# Patient Record
Sex: Male | Born: 1970 | Race: Black or African American | Hispanic: No | Marital: Single | Smoking: Never smoker
Health system: Southern US, Community
[De-identification: ages and names within clinical notes are randomized; demographics above are authoritative.]

## PROBLEM LIST (undated history)

## (undated) DIAGNOSIS — D869 Sarcoidosis, unspecified: Secondary | ICD-10-CM

## (undated) HISTORY — PX: LIVER BIOPSY: SHX301

---

## 2009-12-04 ENCOUNTER — Emergency Department (HOSPITAL_BASED_OUTPATIENT_CLINIC_OR_DEPARTMENT_OTHER): Admission: EM | Admit: 2009-12-04 | Discharge: 2009-12-04 | Payer: Self-pay | Admitting: Emergency Medicine

## 2010-03-25 ENCOUNTER — Emergency Department (HOSPITAL_COMMUNITY): Admission: EM | Admit: 2010-03-25 | Discharge: 2010-03-25 | Payer: Self-pay | Admitting: Emergency Medicine

## 2013-06-01 ENCOUNTER — Other Ambulatory Visit: Payer: Self-pay | Admitting: Family Medicine

## 2013-06-01 DIAGNOSIS — R7989 Other specified abnormal findings of blood chemistry: Secondary | ICD-10-CM

## 2013-06-01 DIAGNOSIS — R945 Abnormal results of liver function studies: Principal | ICD-10-CM

## 2013-06-04 ENCOUNTER — Other Ambulatory Visit: Payer: Self-pay | Admitting: Family Medicine

## 2013-06-04 ENCOUNTER — Ambulatory Visit
Admission: RE | Admit: 2013-06-04 | Discharge: 2013-06-04 | Disposition: A | Discharge status: Home / Self Care | Payer: Managed Care, Other (non HMO) | Source: Ambulatory Visit | Attending: Family Medicine | Admitting: Family Medicine

## 2013-06-04 DIAGNOSIS — R05 Cough: Secondary | ICD-10-CM

## 2013-06-04 DIAGNOSIS — R7989 Other specified abnormal findings of blood chemistry: Secondary | ICD-10-CM

## 2013-06-04 DIAGNOSIS — R059 Cough, unspecified: Secondary | ICD-10-CM

## 2013-06-04 DIAGNOSIS — R945 Abnormal results of liver function studies: Secondary | ICD-10-CM

## 2013-06-04 DIAGNOSIS — R9389 Abnormal findings on diagnostic imaging of other specified body structures: Secondary | ICD-10-CM

## 2013-06-08 ENCOUNTER — Ambulatory Visit
Admission: RE | Admit: 2013-06-08 | Discharge: 2013-06-08 | Disposition: A | Discharge status: Home / Self Care | Payer: Managed Care, Other (non HMO) | Source: Ambulatory Visit | Attending: Family Medicine | Admitting: Family Medicine

## 2013-06-08 DIAGNOSIS — R945 Abnormal results of liver function studies: Principal | ICD-10-CM

## 2013-06-08 DIAGNOSIS — R7989 Other specified abnormal findings of blood chemistry: Secondary | ICD-10-CM

## 2013-06-11 ENCOUNTER — Ambulatory Visit
Admission: RE | Admit: 2013-06-11 | Discharge: 2013-06-11 | Disposition: A | Discharge status: Home / Self Care | Payer: Managed Care, Other (non HMO) | Source: Ambulatory Visit | Attending: Family Medicine | Admitting: Family Medicine

## 2013-06-11 DIAGNOSIS — R945 Abnormal results of liver function studies: Secondary | ICD-10-CM

## 2013-06-11 DIAGNOSIS — R9389 Abnormal findings on diagnostic imaging of other specified body structures: Secondary | ICD-10-CM

## 2013-06-11 DIAGNOSIS — R7989 Other specified abnormal findings of blood chemistry: Secondary | ICD-10-CM

## 2013-06-11 MED ORDER — IOHEXOL 300 MG/ML  SOLN
100.0000 mL | Freq: Once | INTRAMUSCULAR | Status: AC | PRN
  Administered 2013-06-11: 100 mL via INTRAVENOUS

## 2013-06-16 ENCOUNTER — Other Ambulatory Visit (HOSPITAL_COMMUNITY): Payer: Self-pay | Admitting: Family Medicine

## 2013-06-16 DIAGNOSIS — D869 Sarcoidosis, unspecified: Secondary | ICD-10-CM

## 2013-06-17 ENCOUNTER — Other Ambulatory Visit: Payer: Self-pay | Admitting: Radiology

## 2013-06-17 ENCOUNTER — Encounter (HOSPITAL_COMMUNITY): Payer: Self-pay | Admitting: Pharmacy Technician

## 2013-06-18 ENCOUNTER — Other Ambulatory Visit: Payer: Self-pay | Admitting: Radiology

## 2013-06-21 ENCOUNTER — Ambulatory Visit (HOSPITAL_COMMUNITY)
Admission: RE | Admit: 2013-06-21 | Discharge: 2013-06-21 | Disposition: A | Discharge status: Home / Self Care | Payer: Managed Care, Other (non HMO) | Source: Ambulatory Visit | Attending: Family Medicine | Admitting: Family Medicine

## 2013-06-21 ENCOUNTER — Encounter (HOSPITAL_COMMUNITY): Payer: Self-pay

## 2013-06-21 DIAGNOSIS — R634 Abnormal weight loss: Secondary | ICD-10-CM | POA: Insufficient documentation

## 2013-06-21 DIAGNOSIS — R945 Abnormal results of liver function studies: Secondary | ICD-10-CM | POA: Insufficient documentation

## 2013-06-21 DIAGNOSIS — D869 Sarcoidosis, unspecified: Secondary | ICD-10-CM

## 2013-06-21 DIAGNOSIS — R05 Cough: Secondary | ICD-10-CM | POA: Insufficient documentation

## 2013-06-21 DIAGNOSIS — R599 Enlarged lymph nodes, unspecified: Secondary | ICD-10-CM | POA: Insufficient documentation

## 2013-06-21 DIAGNOSIS — K7689 Other specified diseases of liver: Secondary | ICD-10-CM | POA: Insufficient documentation

## 2013-06-21 DIAGNOSIS — R059 Cough, unspecified: Secondary | ICD-10-CM | POA: Insufficient documentation

## 2013-06-21 DIAGNOSIS — R748 Abnormal levels of other serum enzymes: Secondary | ICD-10-CM | POA: Insufficient documentation

## 2013-06-21 LAB — APTT: aPTT: 31 seconds (ref 24–37)

## 2013-06-21 LAB — CBC
HCT: 40.3 % (ref 39.0–52.0)
HEMOGLOBIN: 13.5 g/dL (ref 13.0–17.0)
MCH: 27.6 pg (ref 26.0–34.0)
MCHC: 33.5 g/dL (ref 30.0–36.0)
MCV: 82.2 fL (ref 78.0–100.0)
PLATELETS: 322 10*3/uL (ref 150–400)
RBC: 4.9 MIL/uL (ref 4.22–5.81)
RDW: 14.1 % (ref 11.5–15.5)
WBC: 5.3 10*3/uL (ref 4.0–10.5)

## 2013-06-21 LAB — PROTIME-INR
INR: 1.11 (ref 0.00–1.49)
Prothrombin Time: 14.1 seconds (ref 11.6–15.2)

## 2013-06-21 MED ORDER — FENTANYL CITRATE 0.05 MG/ML IJ SOLN
INTRAMUSCULAR | Status: AC
  Filled 2013-06-21: qty 4

## 2013-06-21 MED ORDER — MIDAZOLAM HCL 2 MG/2ML IJ SOLN
INTRAMUSCULAR | Status: AC | PRN
  Administered 2013-06-21: 1 mg via INTRAVENOUS
  Administered 2013-06-21: 2 mg via INTRAVENOUS

## 2013-06-21 MED ORDER — MIDAZOLAM HCL 2 MG/2ML IJ SOLN
INTRAMUSCULAR | Status: AC
  Filled 2013-06-21: qty 4

## 2013-06-21 MED ORDER — OXYCODONE HCL 5 MG PO TABS
5.0000 mg | ORAL_TABLET | ORAL | Status: DC | PRN
  Filled 2013-06-21: qty 1

## 2013-06-21 MED ORDER — SODIUM CHLORIDE 0.9 % IV SOLN
Freq: Once | INTRAVENOUS | Status: AC
  Administered 2013-06-21: 20 mL/h via INTRAVENOUS

## 2013-06-21 MED ORDER — FENTANYL CITRATE 0.05 MG/ML IJ SOLN
INTRAMUSCULAR | Status: AC | PRN
  Administered 2013-06-21: 100 ug via INTRAVENOUS

## 2013-06-21 NOTE — Procedures (Signed)
US guided liver biopsy.  2 cores obtained.  No immediate complication.

## 2013-06-21 NOTE — Discharge Instructions (Signed)
Conscious Sedation, Adult, Care After °Refer to this sheet in the next few weeks. These instructions provide you with information on caring for yourself after your procedure. Your health care provider may also give you more specific instructions. Your treatment has been planned according to current medical practices, but problems sometimes occur. Call your health care provider if you have any problems or questions after your procedure. °WHAT TO EXPECT AFTER THE PROCEDURE  °After your procedure: °· You may feel sleepy, clumsy, and have poor balance for several hours. °· Vomiting may occur if you eat too soon after the procedure. °HOME CARE INSTRUCTIONS °· Do not participate in any activities where you could become injured for at least 24 hours. Do not: °· Drive. °· Swim. °· Ride a bicycle. °· Operate heavy machinery. °· Cook. °· Use power tools. °· Climb ladders. °· Work from a high place. °· Do not make important decisions or sign legal documents until you are improved. °· If you vomit, drink water, juice, or soup when you can drink without vomiting. Make sure you have little or no nausea before eating solid foods. °· Only take over-the-counter or prescription medicines for pain, discomfort, or fever as directed by your health care provider. °· Make sure you and your family fully understand everything about the medicines given to you, including what side effects may occur. °· You should not drink alcohol, take sleeping pills, or take medicines that cause drowsiness for at least 24 hours. °· If you smoke, do not smoke without supervision. °· If you are feeling better, you may resume normal activities 24 hours after you were sedated. °· Keep all appointments with your health care provider. °SEEK MEDICAL CARE IF: °· Your skin is pale or bluish in color. °· You continue to feel nauseous or vomit. °· Your pain is getting worse and is not helped by medicine. °· You have bleeding or swelling. °· You are still sleepy or  feeling clumsy after 24 hours. °SEEK IMMEDIATE MEDICAL CARE IF: °· You develop a rash. °· You have difficulty breathing. °· You develop any type of allergic problem. °· You have a fever. °MAKE SURE YOU: °· Understand these instructions. °· Will watch your condition. °· Will get help right away if you are not doing well or get worse. °Document Released: 02/03/2013 Document Reviewed: 11/20/2012 °[employer]® Patient Information ©2014 [employer]. ° °Liver Biopsy °Care After °Refer to this sheet in the next few weeks. These discharge instructions provide you with general information on caring for yourself after you leave the hospital. Your caregiver may also give you specific instructions. Your treatment has been planned according to the most current medical practices available, but unavoidable complications sometimes occur. If you have any problems or questions after discharge, please call your caregiver. °HOME CARE INSTRUCTIONS  °· You should rest for 1 to 2 days or as instructed. °· If you go home the same day as your procedure (outpatient), have a responsible adult take you home and stay with you overnight. °· Do not lift more than 5 pounds or play contact sports for 2 weeks. °· Do not drive for 24 hours after this test. °· Do not take medicine containing aspirin or drink alcohol for 1 week after this test. °· Change bandages (dressings) as directed. °· Only take over-the-counter or prescription medicines for pain, discomfort, or fever as directed by your caregiver. °OBTAINING YOUR TEST RESULTS °Not all test results are available during your visit. If your test results are not back   during the visit, make an appointment with your caregiver to find out the results. Do not assume everything is normal if you have not heard from your caregiver or the medical facility. It is important for you to follow up on all of your test results. °SEEK MEDICAL CARE IF:  °· You have increased bleeding (more than a small spot) from the  biopsy site. °· You have redness, swelling, or increasing pain in the biopsy site. °· You have an oral temperature above 102° F (38.9° C). °SEEK IMMEDIATE MEDICAL CARE IF:  °· You develop swelling or pain in the belly (abdomen). °· You develop a rash. °· You have difficulty breathing, feel short of breath, or feel faint. °· You develop any reaction or side effects to medicines given. °MAKE SURE YOU:  °· Understand these instructions. °· Will watch your condition. °· Will get help right away if you are not doing well or get worse. °Document Released: 11/02/2004 Document Revised: 07/08/2011 Document Reviewed: 11/26/2007 °[employer]® Patient Information ©2014 [employer]. ° °

## 2013-06-21 NOTE — H&P (Signed)
Chief Complaint: "I am here for a liver biopsy." Referring Physician: Dr. Duanne Guessewey HPI: Colton Turner is an 43 y.o. male who presented with complaints of weight loss 20lbs and non-productive cough since 03/2013. He denies any hemoptysis, fever, chills or night sweats. He denies any chest pain, but does admit to shortness of breath with exertion. He denies any active bleeding, or taking any blood thinners. He denies any known difficulty with sedation, he denies any history of sleep apnea. He was found to have elevated LFT's and sent for an ultrasound on 06/08/13 findings include negative abdominal ultrasound except for a tiny benign appearing cyst in the right lobe of the liver and later had a CT chest/abdomen/pelvis on 06/11/13 with abnormal findings. The patient is here today for a image guided liver biopsy with concern for sarcoidosis.   Past Medical History: History reviewed. No pertinent past medical history.  Past Surgical History: History reviewed. No pertinent past surgical history.  Family History: History reviewed. No pertinent family history.  Social History:  reports that he has never smoked. He has never used smokeless tobacco. He reports that he does not use illicit drugs. His alcohol history is not on file.  Allergies: No Known Allergies  Medications:   Medication List    Notice   You have not been prescribed any medications.     Please HPI for pertinent positives, otherwise complete 10 system ROS negative.  Physical Exam: BP 117/80  Pulse 95  Temp(Src) 98.2 F (36.8 C) (Oral)  Resp 16  SpO2 98% There is no height or weight on file to calculate BMI.  General Appearance:  Alert, cooperative, no distress  Head:  Normocephalic, without obvious abnormality, atraumatic  Neck: Supple, symmetrical, trachea midline  Lungs:   Clear to auscultation bilaterally, no w/r/r, respirations unlabored without use of accessory muscles.  Chest Wall:  No tenderness or deformity  Heart:   Regular rate and rhythm, S1, S2 normal, no murmur, rub or gallop.  Abdomen:   Soft, non-tender, non distended, (+) BS  Extremities: Extremities normal, atraumatic, no cyanosis or edema  Pulses: 2+ and symmetric  Neurologic: Normal affect, no gross deficits.   Results for orders placed during the hospital encounter of 06/21/13 (from the past 48 hour(s))  APTT     Status: None   Collection Time    06/21/13  8:18 AM      Result Value Ref Range   aPTT 31  24 - 37 seconds  CBC     Status: None   Collection Time    06/21/13  8:18 AM      Result Value Ref Range   WBC 5.3  4.0 - 10.5 K/uL   RBC 4.90  4.22 - 5.81 MIL/uL   Hemoglobin 13.5  13.0 - 17.0 g/dL   HCT 30.840.3  65.739.0 - 84.652.0 %   MCV 82.2  78.0 - 100.0 fL   MCH 27.6  26.0 - 34.0 pg   MCHC 33.5  30.0 - 36.0 g/dL   RDW 96.214.1  95.211.5 - 84.115.5 %   Platelets 322  150 - 400 K/uL  PROTIME-INR     Status: None   Collection Time    06/21/13  8:18 AM      Result Value Ref Range   Prothrombin Time 14.1  11.6 - 15.2 seconds   INR 1.11  0.00 - 1.49   No results found.  Assessment/Plan Weight Loss. Cough. Elevated LFT's Abnormal findings on CT 06/11/13, subpleural nodules, lymphadenopathy, abnormal liver  enhancement.  Request for image guided liver biopsy. Patient has been NPO, labs reviewed, no blood thinners. Risks and Benefits discussed with the patient. All of the patient's questions were answered, patient is agreeable to proceed. Consent signed and in chart.  Pattricia Boss D PA-C 06/21/2013, 9:14 AM

## 2013-06-25 ENCOUNTER — Ambulatory Visit (INDEPENDENT_AMBULATORY_CARE_PROVIDER_SITE_OTHER): Payer: Managed Care, Other (non HMO) | Admitting: Pulmonary Disease

## 2013-06-25 ENCOUNTER — Encounter: Payer: Self-pay | Admitting: Pulmonary Disease

## 2013-06-25 VITALS — BP 122/86 | HR 91 | Temp 98.3°F | Ht 70.0 in | Wt 155.4 lb

## 2013-06-25 DIAGNOSIS — D869 Sarcoidosis, unspecified: Secondary | ICD-10-CM | POA: Insufficient documentation

## 2013-06-25 DIAGNOSIS — R059 Cough, unspecified: Secondary | ICD-10-CM | POA: Insufficient documentation

## 2013-06-25 DIAGNOSIS — R05 Cough: Secondary | ICD-10-CM | POA: Insufficient documentation

## 2013-06-25 NOTE — Assessment & Plan Note (Signed)
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 He is currently on a moderate dose of prednisone that he just started, and I would like to initiate treatment with inhaled corticosteroids to help his cough until the prednisone kicks in or his dose is increased to higher levels for treatment of hepatic involvement. I think his cough will resolve once he has been on prednisone for a period of time.

## 2013-06-25 NOTE — Assessment & Plan Note (Signed)
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 He is also had a positive ACE level, and abnormal LFTs. He has. Limited disease on his CT chest, primarily with hilar and mediastinal lymphadenopathy. He does not have an interstitial process, nor does he have significant pulmonary symptoms at this time. He does have a dry cough but I suspect is related to airway involvement with sarcoid. This is typically treated with inhaled corticosteroids if the patient is not going to stay on oral prednisone at higher doses. The patient does not require prednisone from a pulmonary standpoint, but I totally agree with GI consultation since his liver is significantly involved. The aggressiveness of therapy will really be determined by his liver disease. I have had a long discussion with him about the pathophysiology of sarcoidosis, and how we usually treat only with symptoms and end organ damage.  I would like to schedule for pulmonary function studies to establish his baseline, and I have stressed to him the importance of yearly eye exams.

## 2013-06-25 NOTE — Progress Notes (Signed)
   Subjective:    Patient ID: Colton Turner, male    DOB: 12/21/1970, 43 y.o.   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 He was in his usual state of health until December of last year, when he began to develop ankle and feet pain, as well as a dry cough. He began to have worsening symptoms, and ultimately had blood work that showed elevated liver function tests. He also had a CT of his chest which showed hilar and mediastinal lymphadenopathy. A few small lung nodules, but no evidence for interstitial lung disease. He was also noted to have an abnormal liver on the scan. 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 Special stains were unremarkable. The patient had a very abnormal ACE level of 242. He has continued to have a dry and hacking cough, and was started on prednisone at moderate doses approximately 2 days ago. He is also been scheduled to see a gastroenterologist. The patient has had weight loss and anorexia, but has not had a lot of other pulmonary symptoms. He does have some shortness of breath with very heavy exertional activities.   Review of Systems  Constitutional: Positive for appetite change and unexpected weight change. Negative for fever.  HENT: Negative for congestion, dental problem, ear pain, nosebleeds, postnasal drip, rhinorrhea, sinus pressure, sneezing, sore throat and trouble swallowing.   Eyes: Negative for redness and itching.  Respiratory: Positive for cough and shortness of breath. Negative for chest tightness and wheezing.   Cardiovascular: Positive for chest pain. Negative for palpitations and leg swelling.  Gastrointestinal: Negative for nausea and vomiting.  Genitourinary: Negative for dysuria.  Musculoskeletal: Negative for joint swelling.  Skin: Negative for rash.  Neurological: Negative  for headaches.  Hematological: Does not bruise/bleed easily.  Psychiatric/Behavioral: Negative for dysphoric mood. The patient is not nervous/anxious.        Objective:   Physical Exam Constitutional:  Well developed, no acute distress  HENT:  Nares patent without discharge  Oropharynx without exudate, palate and uvula are normal  Eyes:  Perrla, eomi, no scleral icterus  Neck:  No JVD, no TMG  Cardiovascular:  Normal rate, regular rhythm, no rubs or gallops.  No murmurs        Intact distal pulses  Pulmonary :  Normal breath sounds, no stridor or respiratory distress   No rales, rhonchi, or wheezing  Abdominal:  Soft, nondistended, bowel sounds present.  No tenderness noted.   Musculoskeletal:  No lower extremity edema noted.  Lymph Nodes:  No cervical lymphadenopathy noted  Skin:  No cyanosis noted  Neurologic:  Alert, appropriate, moves all 4 extremities without obvious deficit.         Assessment & Plan:

## 2013-06-25 NOTE — Patient Instructions (Signed)
Will start on qvar , take 2 puffs am and pm everyday.  Rinse your mouth well.  Would like to see how your cough does on this, but let me know if your gastroenterologist puts you on high dose prednisone (you will not require the qvar if this is the case). Will schedule for breathing tests at your convenience to establish your baseline lung function. Make sure you see an eye doctor sometime soon and yearly. Please call me in 2-3 weeks with your response to the qvar, and will schedule a followup with me after that.

## 2013-06-30 ENCOUNTER — Ambulatory Visit (INDEPENDENT_AMBULATORY_CARE_PROVIDER_SITE_OTHER): Payer: Managed Care, Other (non HMO) | Admitting: Pulmonary Disease

## 2013-06-30 DIAGNOSIS — D869 Sarcoidosis, unspecified: Secondary | ICD-10-CM

## 2013-06-30 DIAGNOSIS — R059 Cough, unspecified: Secondary | ICD-10-CM

## 2013-06-30 DIAGNOSIS — R05 Cough: Secondary | ICD-10-CM

## 2013-06-30 NOTE — Progress Notes (Signed)
PFT done today. 

## 2013-07-07 LAB — PULMONARY FUNCTION TEST
DL/VA % pred: 106 %
DL/VA: 4.97 ml/min/mmHg/L
DLCO unc % pred: 85 %
DLCO unc: 27.74 ml/min/mmHg
FEF 25-75 POST: 2.98 L/s
FEF 25-75 Pre: 2.31 L/sec
FEF2575-%CHANGE-POST: 29 %
FEF2575-%Pred-Post: 83 %
FEF2575-%Pred-Pre: 64 %
FEV1-%CHANGE-POST: 6 %
FEV1-%PRED-POST: 94 %
FEV1-%Pred-Pre: 88 %
FEV1-PRE: 3.07 L
FEV1-Post: 3.29 L
FEV1FVC-%Change-Post: 4 %
FEV1FVC-%PRED-PRE: 91 %
FEV6-%Change-Post: 3 %
FEV6-%PRED-POST: 100 %
FEV6-%Pred-Pre: 97 %
FEV6-Post: 4.23 L
FEV6-Pre: 4.1 L
FEV6FVC-%Change-Post: 0 %
FEV6FVC-%PRED-PRE: 101 %
FEV6FVC-%Pred-Post: 102 %
FVC-%Change-Post: 2 %
FVC-%Pred-Post: 98 %
FVC-%Pred-Pre: 96 %
FVC-Post: 4.23 L
FVC-Pre: 4.13 L
PRE FEV6/FVC RATIO: 99 %
Post FEV1/FVC ratio: 78 %
Post FEV6/FVC ratio: 100 %
Pre FEV1/FVC ratio: 74 %
RV % pred: 99 %
RV: 1.9 L
TLC % pred: 87 %
TLC: 6.06 L

## 2013-09-20 ENCOUNTER — Emergency Department (HOSPITAL_COMMUNITY)
Admission: EM | Admit: 2013-09-20 | Discharge: 2013-09-20 | Disposition: A | Discharge status: Home / Self Care | Payer: Managed Care, Other (non HMO) | Attending: Emergency Medicine | Admitting: Emergency Medicine

## 2013-09-20 ENCOUNTER — Emergency Department (HOSPITAL_COMMUNITY): Payer: Managed Care, Other (non HMO)

## 2013-09-20 ENCOUNTER — Encounter (HOSPITAL_COMMUNITY): Payer: Self-pay | Admitting: Emergency Medicine

## 2013-09-20 DIAGNOSIS — G510 Bell's palsy: Secondary | ICD-10-CM | POA: Insufficient documentation

## 2013-09-20 DIAGNOSIS — IMO0002 Reserved for concepts with insufficient information to code with codable children: Secondary | ICD-10-CM | POA: Insufficient documentation

## 2013-09-20 DIAGNOSIS — Z8619 Personal history of other infectious and parasitic diseases: Secondary | ICD-10-CM | POA: Insufficient documentation

## 2013-09-20 HISTORY — DX: Sarcoidosis, unspecified: D86.9

## 2013-09-20 MED ORDER — PREDNISONE 10 MG PO TABS: 20.0000 mg | ORAL_TABLET | Freq: Every day | ORAL | Status: AC

## 2013-09-20 MED ORDER — GADOBENATE DIMEGLUMINE 529 MG/ML IV SOLN
15.0000 mL | Freq: Once | INTRAVENOUS | Status: AC | PRN
  Administered 2013-09-20: 15 mL via INTRAVENOUS

## 2013-09-20 MED ORDER — PREDNISONE 20 MG PO TABS
20.0000 mg | ORAL_TABLET | Freq: Once | ORAL | Status: AC
  Administered 2013-09-20: 20 mg via ORAL
  Filled 2013-09-20: qty 1

## 2013-09-20 NOTE — Discharge Instructions (Signed)
Bell's Palsy Get artificial tears in place 2 drops in the left eye every 2 hours while awake. Get Lacrilube gel and placed into your eye at night before going to bed to prevent eye drying. Called [facility] tomorrow to schedule the next available office appointment . Tell office staff that you have seen here and evaluated by the neurologist here Bell's palsy is a condition in which one side of the face becomes temporarily weak or paralyzed. Most of the time no cause is found. A viral infection of the facial nerve is the most commonly suspected cause. The condition almost always clears up in a few weeks to months. However, in a small number of people, the weakness can be permanent. Sometimes steroid medicines and antiviral medicines are prescribed to improve recovery time. Blood and other tests are usually not needed, but they may be performed at your caregiver's discretion, to rule out other causes. Careful follow up is importantto be sure the facial nerve is recovering. Because facial weakness can make it hard to blink, it is important to prevent drying of the eye. Artificial tears are often prescribed to keep the eye lubricated. Glasses or an eye patch should be worn to protect the eye, if you cannot close your eye completely. If the eye is not protected, permanent damage can be done to the cornea (clear covering over your eye). Sometimes facial massage and exercises help weakened muscles recover.  SEEK IMMEDIATE MEDICAL CARE IF:   You have increased weakness, earache, headache, or dizziness.  You develop new problems or symptoms, or the area of weakness or paralysis extends beyond the face.  You feel you are getting worse. Document Released: 05/23/2004 Document Revised: 07/08/2011 Document Reviewed: 04/24/2009 [facility] Patient Information 2014 [place], [place].

## 2013-09-20 NOTE — ED Notes (Signed)
Neuro into see pt to MRI

## 2013-09-20 NOTE — ED Notes (Signed)
hes had numbness to R side of mouth since Friday, states "it feels like the dentist gave me novacaine." then today he noticed his R eye was twitching so he decided to come to ER. He denies pain. He is A&Ox4, MAE, ambulatory, speech is clear.

## 2013-09-20 NOTE — ED Notes (Signed)
Dr j spoke to neuro and i spoke to MRI it will be about 1 1/2 hour before pt goes to xray pt and fam informed

## 2013-09-20 NOTE — Consult Note (Addendum)
NEURO HOSPITALIST CONSULT NOTE    Reason for Consult: left facial weakness, right face paresthesias  HPI:                                                                                                                                          Colton Turner is an 43 y.o. male, right handed, with a past medical history significant for sarcoidosis off steroids for > 1 month, comes in today for evaluation of the above mentioned complains. He said that he never had siilar symptoms before, but 3 days ago started having " a funny feeling" in the right side of his face and right side of the tongue which feels numb. He stated that his right eyelid has blinking uncontrollably and that it is difficulty to swallow and chew and thus eating and drinking is problematic. The, he noticed that he can not blink in the left eye, can not whistle. Denies changes in taste, retroauricular pain, sound amplification or hearing impairment. No facial pain. No recent upper respiratory infection. Denies HA, vertigo, tinnitus, double vision, slurred speech, unsteadiness, arm or legs weakness, language or visual disturbances.    Past Medical History  Diagnosis Date  . Sarcoidosis     Past Surgical History  Procedure Laterality Date  . Liver biopsy      History reviewed. No pertinent family history.   Social History:  reports that he has never smoked. He has never used smokeless tobacco. He reports that he drinks alcohol. He reports that he does not use illicit drugs.  No Known Allergies  MEDICATIONS:                                                                                                                     I have reviewed the patient's current medications. Scheduled:   ROS:  History obtained from the patient  General ROS: negative for - chills,  fatigue, fever, night sweats, weight gain or weight loss Psychological ROS: negative for - behavioral disorder, hallucinations, memory difficulties, mood swings or suicidal ideation Ophthalmic ROS: negative for - blurry vision, double vision, eye pain or loss of vision ENT ROS: negative for - epistaxis, nasal discharge, oral lesions, sore throat, tinnitus or vertigo Allergy and Immunology ROS: negative for - hives or itchy/watery eyes Hematological and Lymphatic ROS: negative for - bleeding problems, bruising or swollen lymph nodes Endocrine ROS: negative for - galactorrhea, hair pattern changes, polydipsia/polyuria or temperature intolerance Respiratory ROS: negative for - cough, hemoptysis, shortness of breath or wheezing Cardiovascular ROS: negative for - chest pain, dyspnea on exertion, edema or irregular heartbeat Gastrointestinal ROS: negative for - abdominal pain, diarrhea, hematemesis, nausea/vomiting or stool incontinence Genito-Urinary ROS: negative for - dysuria, hematuria, incontinence or urinary frequency/urgency Musculoskeletal ROS: negative for - joint swelling or muscular weakness Neurological ROS: as noted in HPI Dermatological ROS: negative for rash and skin lesion changes  Physical exam: pleasant male in no apparent distress.Blood pressure 140/83, pulse 91, temperature 97.7 F (36.5 C), temperature source Oral, resp. rate 16, height 5\' 10"  (1.778 m), weight 69.854 kg (154 lb), SpO2 100.00%. Head: normocephalic. Ears: no auricular skin lesions Neck: supple, no bruits, no JVD. Cardiac: no murmurs. Lungs: clear. Abdomen: soft, no tender, no mass. Extremities: no edema.  Neurologic Examination:                                                                                                      Mental Status: Alert, oriented, thought content appropriate.  Speech fluent without evidence of aphasia.  Able to follow 3 step commands without difficulty. Cranial Nerves: II: Discs  flat bilaterally; Visual fields grossly normal, pupils equal, round, reactive to light and accommodation III,IV, VI: ptosis not present, extra-ocular motions intact bilaterally V,VII: smile symmetric but weak left orbicularis oculi and buccinator , facial light touch sensation normal bilaterally VIII: hearing normal bilaterally IX,X: gag reflex present XI: bilateral shoulder shrug XII: midline tongue extension without atrophy or fasciculations  Motor: Right : Upper extremity   5/5    Left:     Upper extremity   5/5  Lower extremity   5/5     Lower extremity   5/5 Tone and bulk:normal tone throughout; no atrophy noted Sensory: Pinprick and light touch intact throughout, bilaterally Deep Tendon Reflexes:  Right: Upper Extremity   Left: Upper extremity   biceps (C-5 to C-6) 2/4   biceps (C-5 to C-6) 2/4 tricep (C7) 2/4    triceps (C7) 2/4 Brachioradialis (C6) 2/4  Brachioradialis (C6) 2/4  Lower Extremity Lower Extremity  quadriceps (L-2 to L-4) 2/4   quadriceps (L-2 to L-4) 2/4 Achilles (S1) 2/4   Achilles (S1) 2/4  Plantars: Right: downgoing   Left: downgoing Cerebellar: normal finger-to-nose,  normal heel-to-shin test Gait:  No tested CV: pulses palpable throughout    No results found for this basename: cbc, bmp, coags, chol, tri, ldl, hga1c    No results  found for this or any previous visit (from the past 48 hour(s)).  No results found.  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 Agree with MRI brain with and without contrast. Will need to resume steroids. Will follow up after completion of MRI.   Wyatt Portelasvaldo Cassy Sprowl, MD 09/20/2013, 10:33 AM [facility]   MRI brain with and without contrast is unremarkable. Neurosarcoidosis is still a possible diagnosis as MRI  doesn't necessarily ruled out NS. Recommend: LP and starting Prednisone after LP. This won't be feasible in the ED and thus advised to discharge patient with outpatient neurology follow up.  Wyatt Portelasvaldo Kadeidra Coryell ,MD

## 2013-09-20 NOTE — ED Provider Notes (Addendum)
CSN: [id number]     Arrival date & time 09/20/13  7517 History   First MD Initiated Contact with Patient 09/20/13 [phone]     Chief Complaint  Patient presents with  . Numbness     (Consider location/radiation/quality/duration/timing/severity/associated sxs/prior Treatment) HPI Complains of numbness of the right side of his face and right side of his tongue and his right eye blinking uncontrollably onset 09/17/13, gradually Past Medical History  Diagnosis Date  . Sarcoidosis    Past Surgical History  Procedure Laterality Date  . Liver biopsy     History reviewed. No pertinent family history. History  Substance Use Topics  . Smoking status: Never Smoker   . Smokeless tobacco: Never Used  . Alcohol Use: Yes     Comment: social    history of present illness (continued). He denies pain denies visual changes denies weakness in arms or legs denies difficulty with speaking. No treatment prior to coming here nothing makes symptoms better or worse. No other associated symptoms . Review of Systems  Neurological: Positive for numbness.       Right facial numbness  All other systems reviewed and are negative.     Allergies  Review of patient's allergies indicates no known allergies.  Home Medications   Prior to Admission medications   Medication Sig Start Date End Date Taking? Authorizing Provider  predniSONE (DELTASONE) 20 MG tablet Take 20 mg by mouth daily with breakfast.    Historical Provider, MD   BP 140/83  Pulse 91  Temp(Src) 97.7 F (36.5 C) (Oral)  Resp 16  Ht 5\' 10"  (1.778 m)  Wt 154 lb (69.854 kg)  BMI 22.10 kg/m2  SpO2 100% Physical Exam  Nursing note and vitals reviewed. Constitutional: He is oriented to person, place, and time. He appears well-developed and well-nourished.  HENT:  Head: Normocephalic and atraumatic.  Right Ear: External ear normal.  Left Ear: External ear normal.  Nose: Nose normal.  Mouth/Throat: Oropharynx is clear and moist.  Eyes:  Conjunctivae are normal. Pupils are equal, round, and reactive to light.  Neck: Neck supple. No tracheal deviation present. No thyromegaly present.  Cardiovascular: Normal rate and regular rhythm.   No murmur heard. Pulmonary/Chest: Effort normal and breath sounds normal.  Abdominal: Soft. Bowel sounds are normal. He exhibits no distension. There is no tenderness.  Musculoskeletal: Normal range of motion. He exhibits no edema and no tenderness.  Neurological: He is alert and oriented to person, place, and time. Coordination normal.  Left sided peripheral cranial nerve palsy other cranial nerves II through XII grossly intact  Skin: Skin is warm and dry. No rash noted.  Psychiatric: He has a normal mood and affect.    ED Course  Procedures (including critical care time) Labs Review Labs Reviewed - No data to display  Imaging Review No results found.   EKG Interpretation None      MDM  Clinically patient has left-sided seventh peripheral cranial nerve palsy however his complaint is one in the right face. Therefore I've ordered an MRI of brain with and without contrast andNeurology consult Final diagnoses:  None  Dr. Cyril Mourning evaluate patient. Plan outpatient followup with [facility] Prescription prednisone. 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

## 2013-09-20 NOTE — ED Notes (Signed)
Rt side of face felt funny and the left side of face was drawing up ,rt  Eye was twitching, no pain all started on Friday he states

## 2015-02-09 IMAGING — CT CT ABD-PELV W/ CM
2 of 5 series · 11 of 36 positions shown, 18 images · IV contrast (READICAT/WATER & [ID] OMNI 300)
Comparison: US ABDOMEN COMPLETE dated 06/08/2013; DG CHEST 2 VIEW
dated 06/04/2013

CLINICAL DATA: Hilar adenopathy on chest radiograph, elevated LFTs

EXAM:
CT CHEST, ABDOMEN, AND PELVIS WITH CONTRAST
TECHNIQUE: Multidetector CT imaging of the chest, abdomen and pelvis was
performed following the standard protocol during bolus
administration of intravenous contrast.
CONTRAST:  100 cc Omnipaque

[Series 601: coronal body · coronal · 1.29mm/px · 1 of 113 slices shown, 2 images]
[im 38/113  soft-tissue]
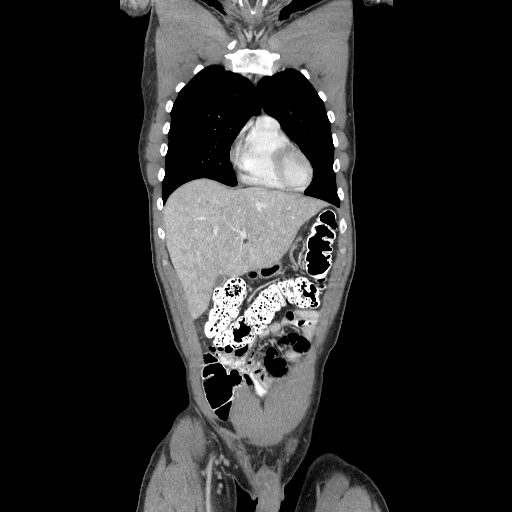
[im 38/113  bone]
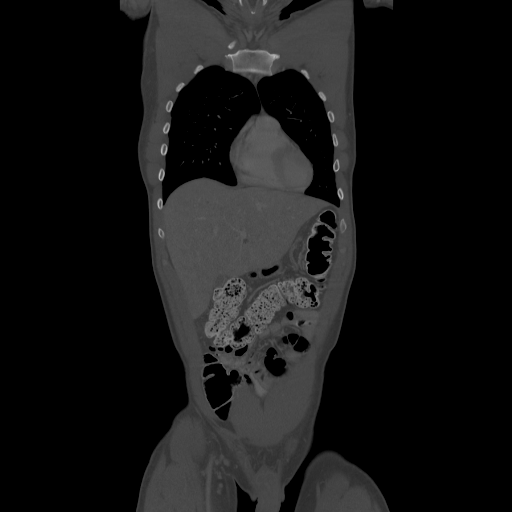

[Series 602: sagittal body · sagittal · 1.29mm/px · 10 of 152 slices shown, 16 images]
[im 13/152  soft-tissue]
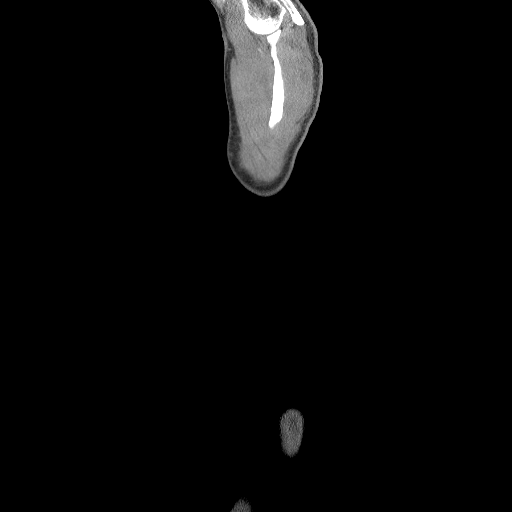
[im 13/152  lung]
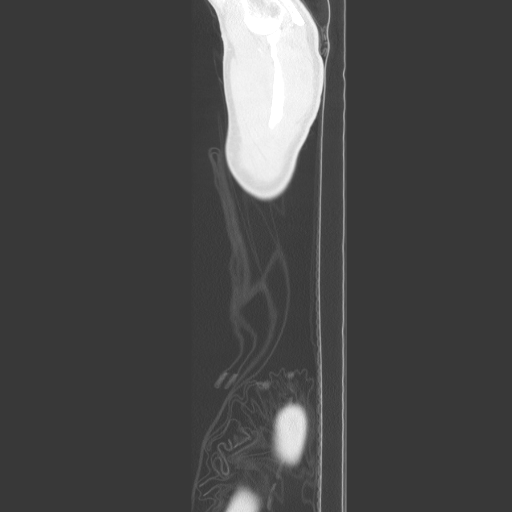
[im 13/152  bone]
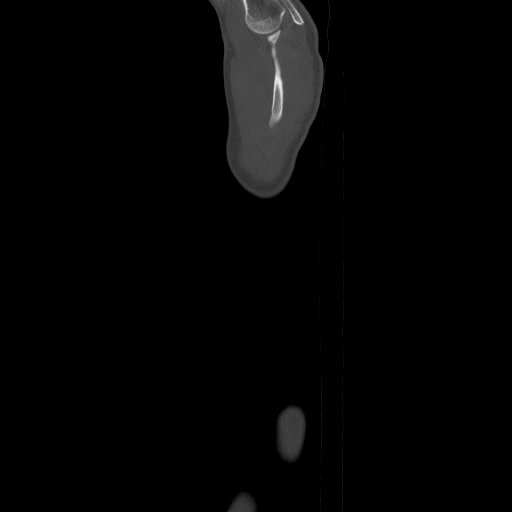
[im 26/152  soft-tissue]
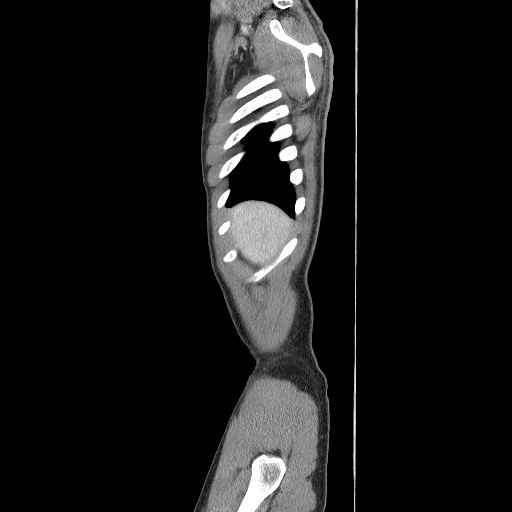
[im 26/152  lung]
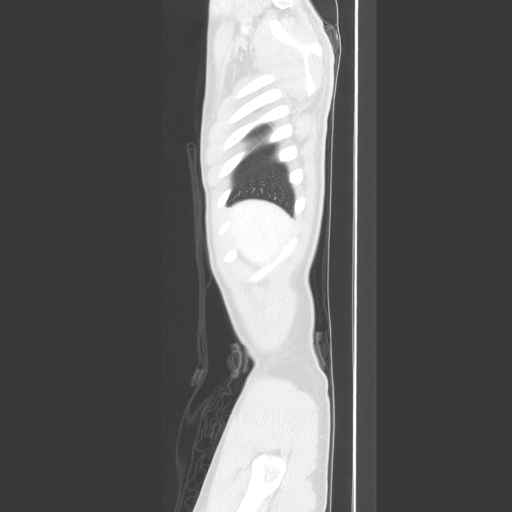
[im 38/152  soft-tissue]
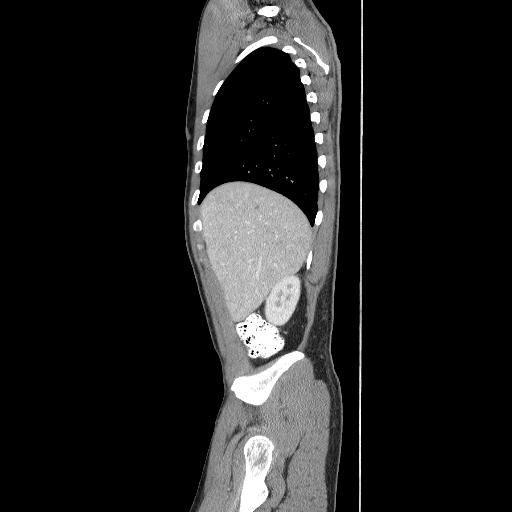
[im 38/152  lung]
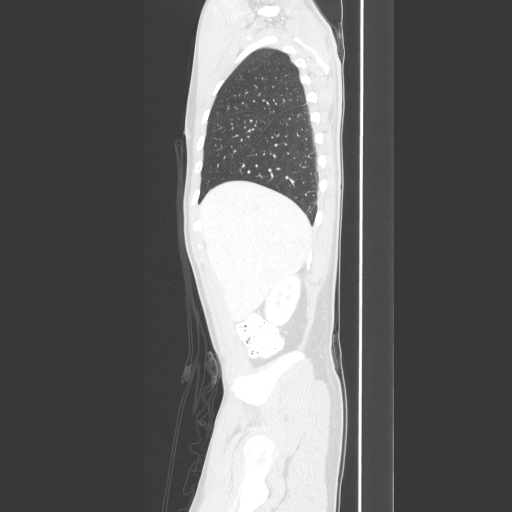
[im 51/152  soft-tissue]
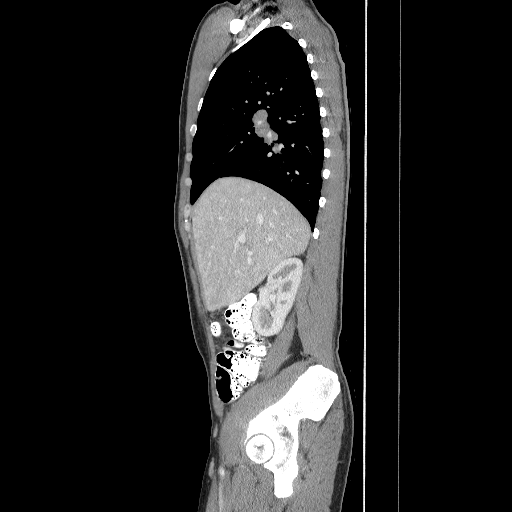
[im 51/152  lung]
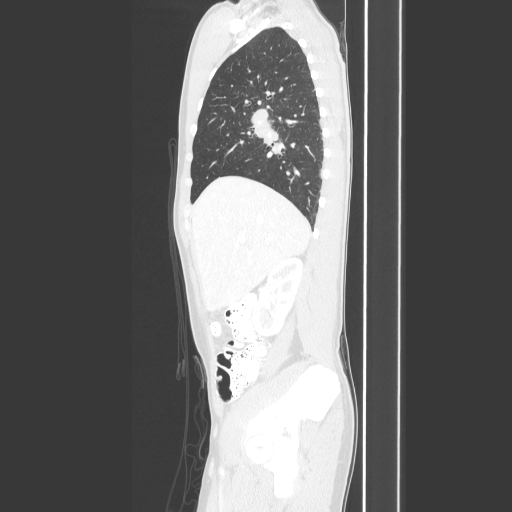
[im 63/152  soft-tissue]
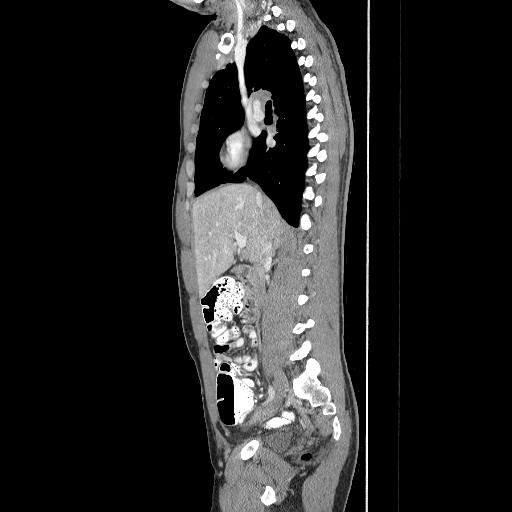
[im 89/152  soft-tissue]
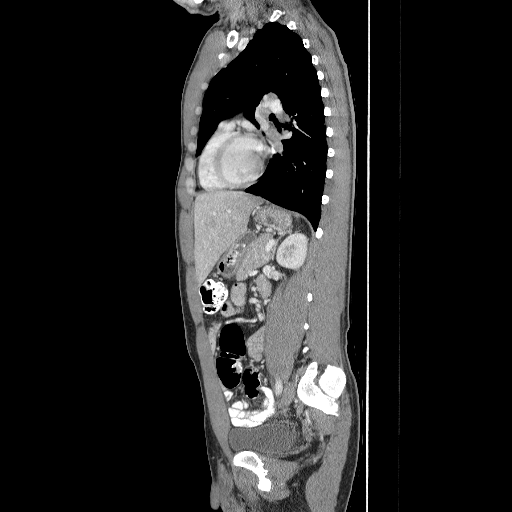
[im 101/152  soft-tissue]
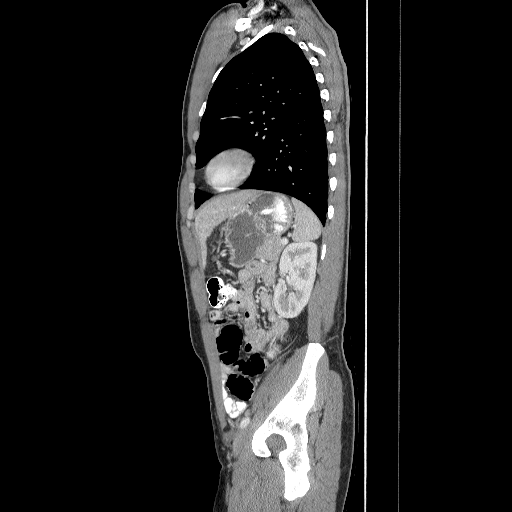
[im 114/152  soft-tissue]
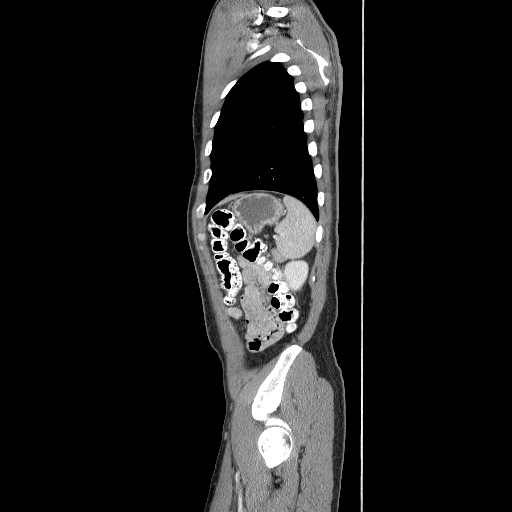
[im 126/152  soft-tissue]
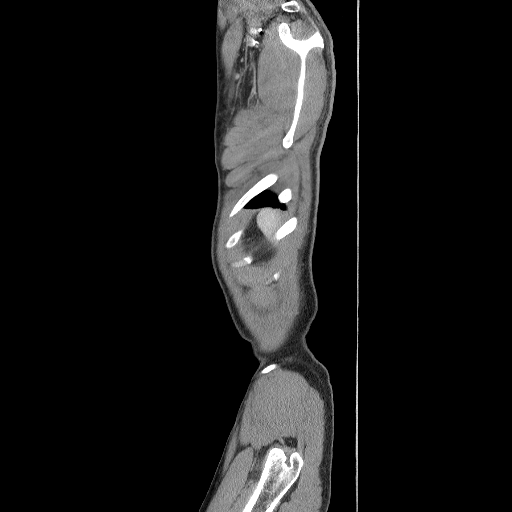
[im 126/152  bone]
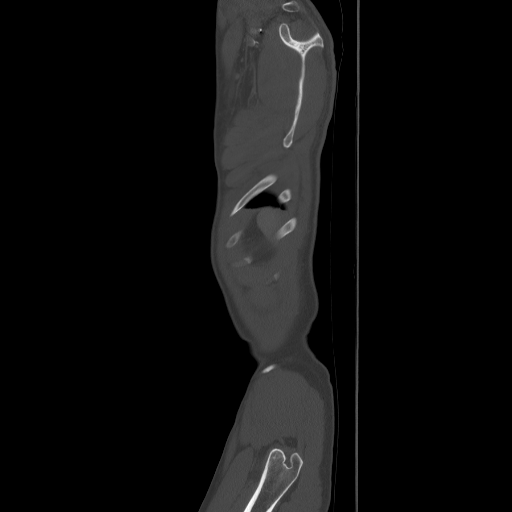
[im 139/152  soft-tissue]
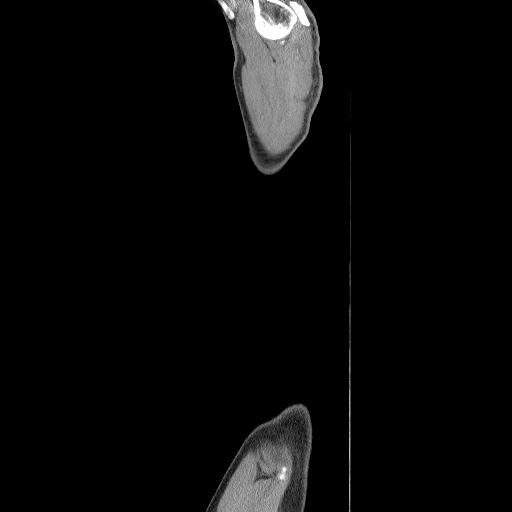

[11 of 36 positions shown; findings below may reference images not displayed]

FINDINGS: CT CHEST FINDINGS

There mild skin thickening in the axilla. Small axillary lymph nodes
are noted. There is enlarged paratracheal and hilar lymph nodes.
Individual right hilar lymph node measures 20 mm (image 32). Right
lower paratracheal lymph node measures 16 mm short axis. 15 mm
subcarinal lymph node. No supraclavicular lymphadenopathy
identified.

No pericardial fluid. Esophagus is normal. Review of the lung
parenchyma demonstrates a nodular pleural thickening at the right
lung apex measuring 18 mm (image number 9, series 4). There is a
nodule along the right oblique fissure measuring 7 mm (image 33). .
There is a pleural nodule along the left oblique fissure measuring 5
mm (image 32 of series 4

CT ABDOMEN AND PELVIS FINDINGS

Heterogeneous enhancement pattern of the liver suggests multiple
small lesions throughout the liver. Portal veins are patent. There
is no evidence ascites. The gallbladder, pancreas, spleen, adrenal
glands, and kidneys are normal.

The stomach, small bowel and colon are normal.

Abdominal or is normal caliber. No retroperitoneal periportal
lymphadenopathy.

No free fluid the pelvis. Prostate gland and bladder normal. No
pelvic lymphadenopathy.
IMPRESSION: 1. Mediastinal and hilar lymphadenopathy. Differential includes
lymphoma, sarcoidosis, and HIV lymphadenopathy.
2. There are two subpleural nodules. While this is not typical
pattern of pulmonary sarcoidosis would have to consider this in
differential. Right upper lobe nodule pleural thickening warrants
attention on follow-up versus evaluation of with FDG PET scan.
3. Abnormal enhancement pattern of liver suggests multiple small
hepatic lesions throughout the liver parenchyma. This can be a
pattern present in sarcoidosis. Cannot exclude a more malignant
process.
4. Strategy for management could include random liver biopsy, FDG
FDG PET scan, and clinical correlation with sarcoidosis including
angiotensin converting enzyme serum levels.
These results will be called to the ordering clinician or
representative by the Radiologist Assistant, and communication
documented in the PACS Dashboard.

## 2015-02-19 IMAGING — US US BIOPSY
1 series · 12 of 12 positions shown · non-contrast
Comparison: none

CLINICAL DATA: 42-year-old with elevated liver enzymes and chest
lymphadenopathy. Evaluate for sarcoidosis.

[Series 1: us biopsy · 0.24mm/px · 12 of 12 slices shown]
[im 1/12]
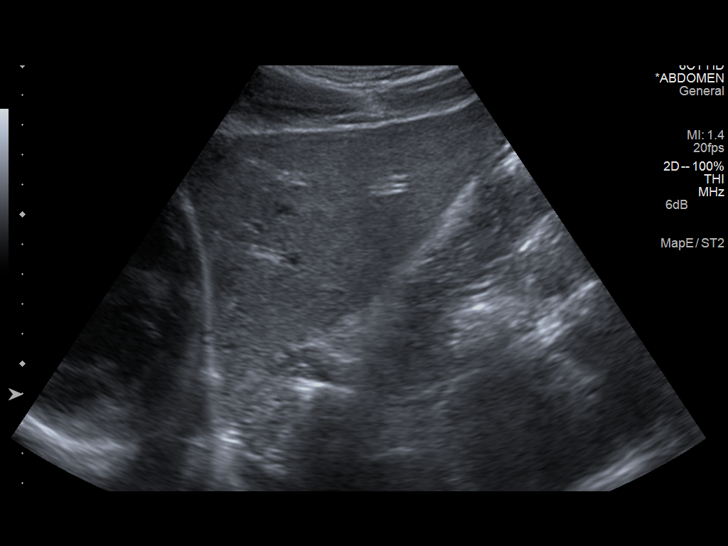
[im 2/12]
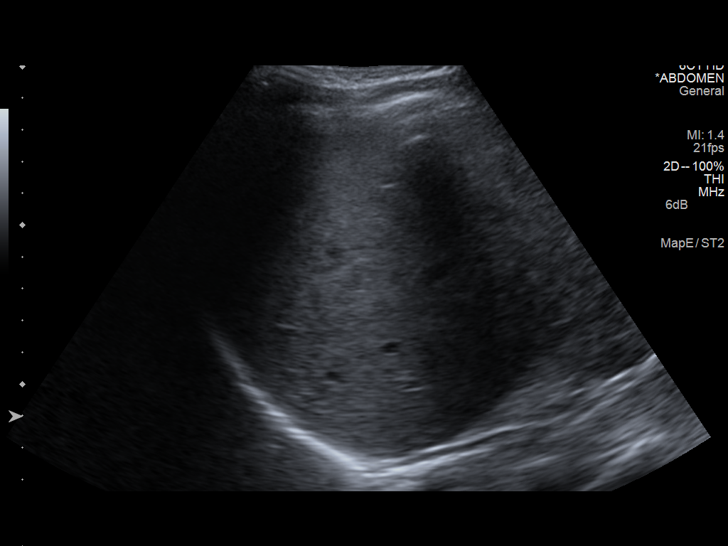
[im 3/12]
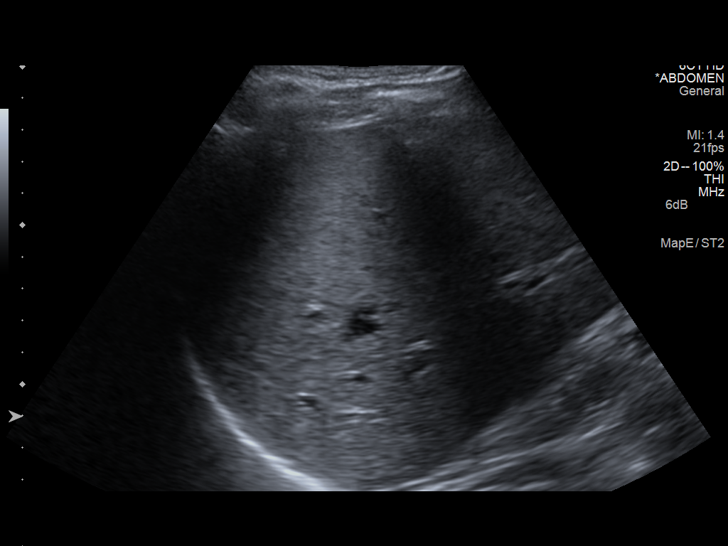
[im 4/12]
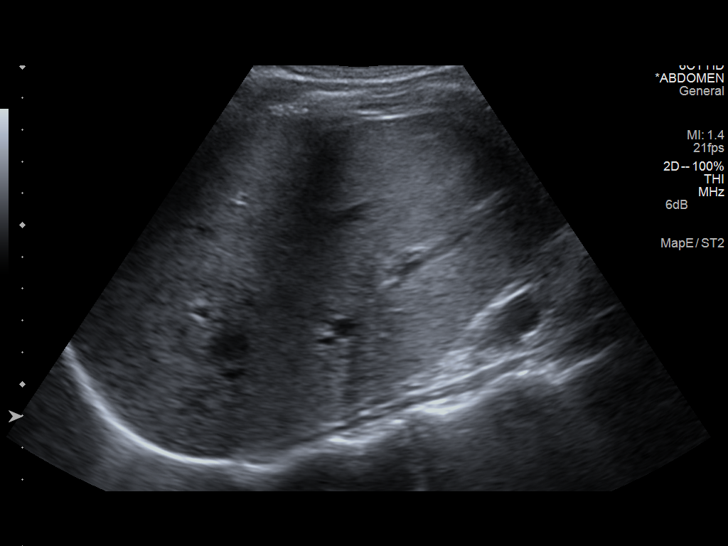
[im 5/12]
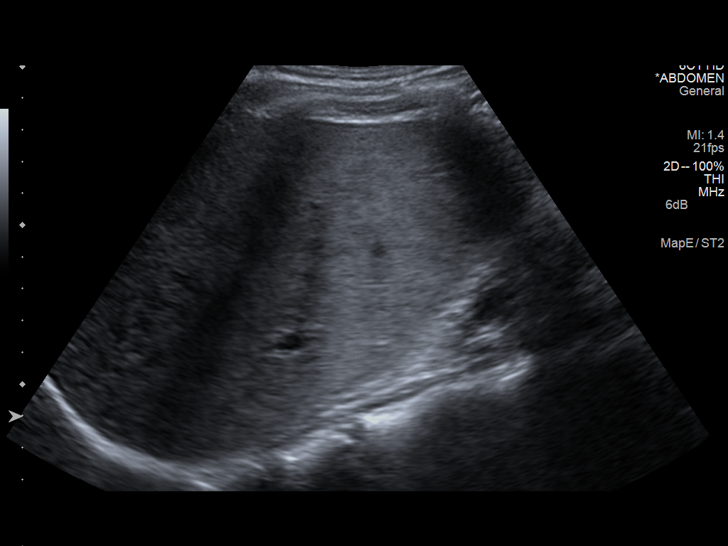
[im 6/12]
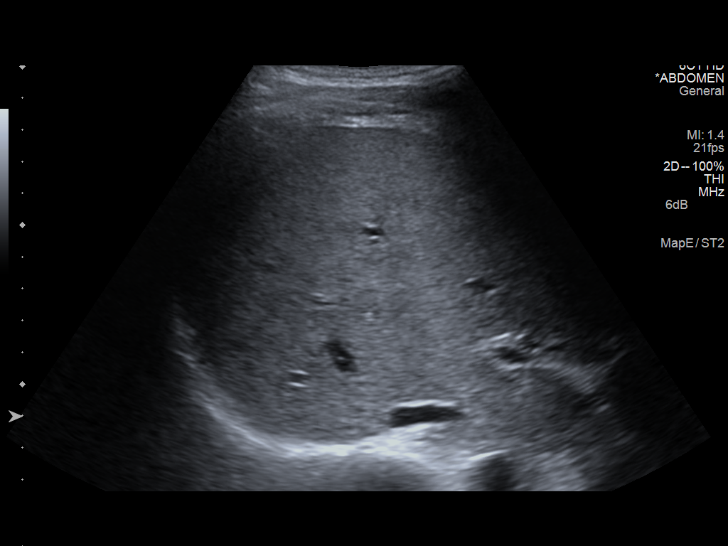
[im 7/12]
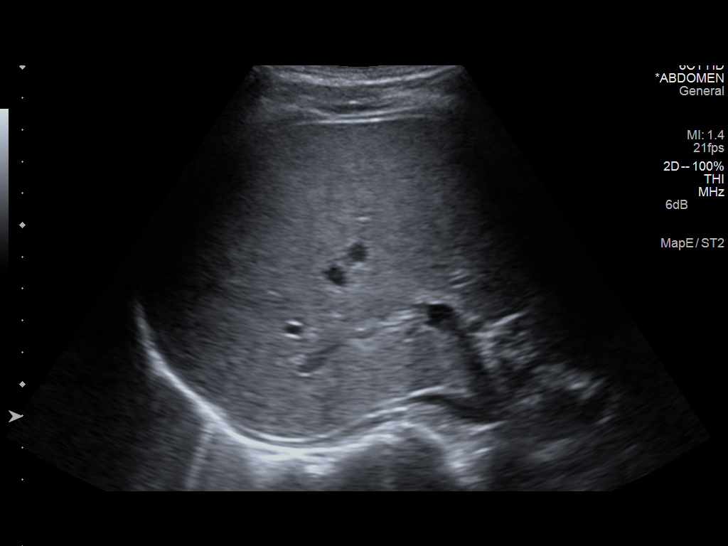
[im 8/12]
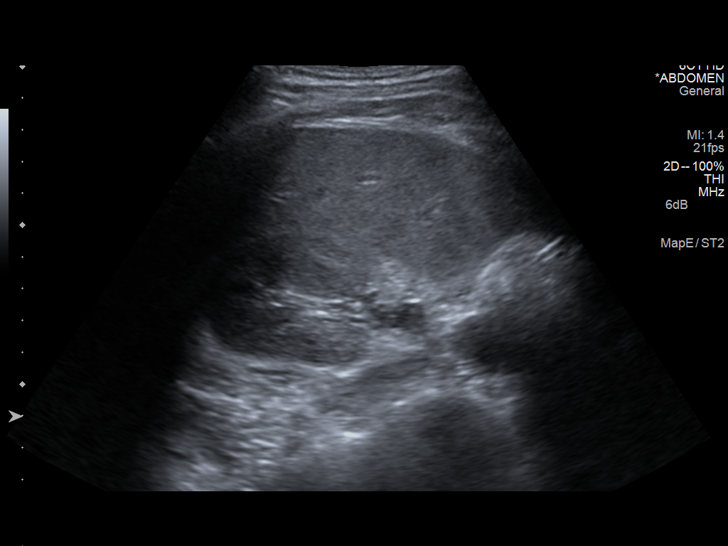
[im 9/12]
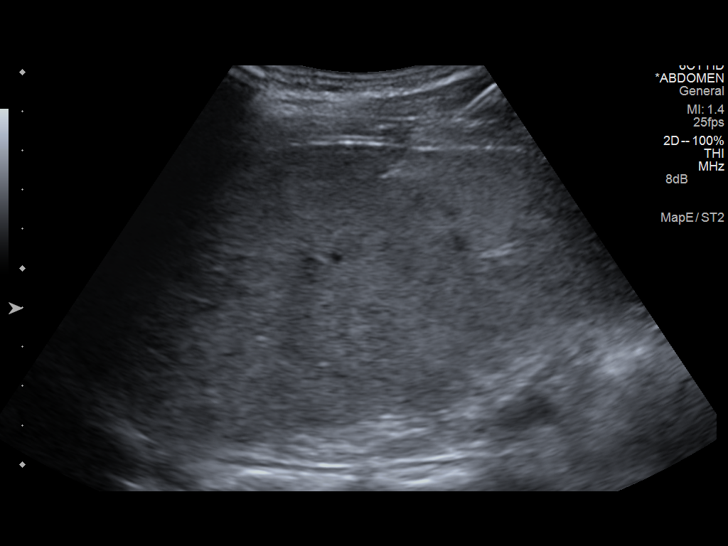
[im 10/12]
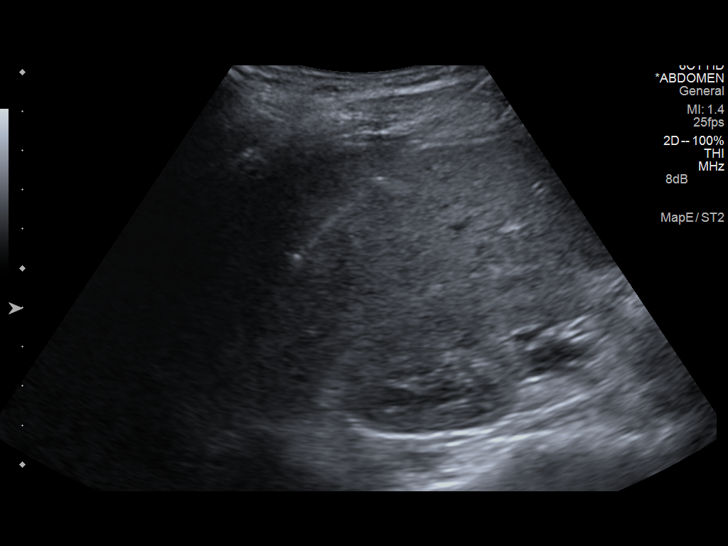
[im 11/12]
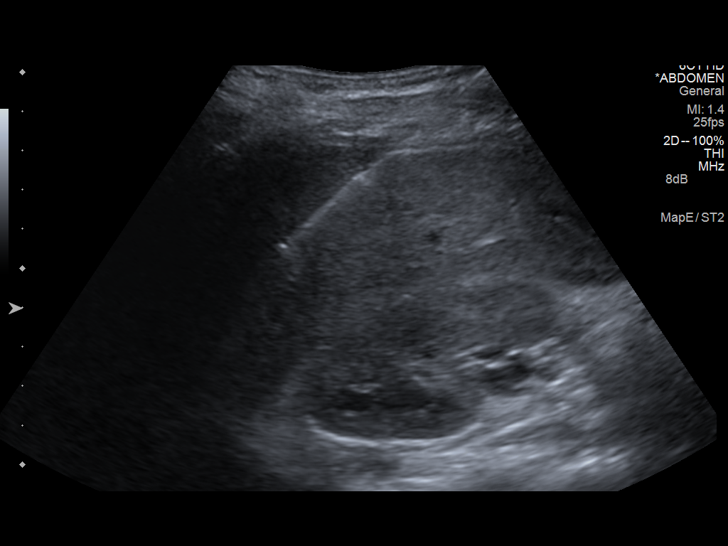
[im 12/12]
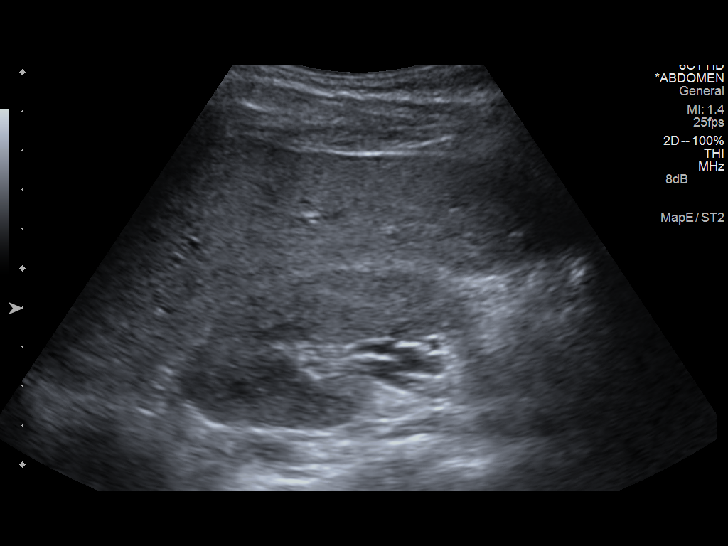

[12 of 12 positions shown; findings below may reference images not displayed]

EXAM:
ULTRASOUND GUIDED CORE BIOPSY OF LIVER

MEDICATIONS:
3.0 mg IV Versed; 100 mcg IV Fentanyl. A radiology nurse monitored
the patient for moderate sedation.

Total Moderate Sedation Time: 10 min

PROCEDURE:
The procedure, risks, benefits, and alternatives were explained to
the patient. Questions regarding the procedure were encouraged and
answered. The patient understands and consents to the procedure.

The liver was evaluated with ultrasound. The right hepatic lobe was
selected for biopsy. The right side of the abdomen was prepped with
Betadine and a sterile field was created. The skin and soft tissues
were anesthetized with 1% lidocaine. A 17 gauge needle was directed
into the right hepatic lobe with real-time ultrasound guidance. Two
core biopsies were obtained with an 18 gauge core device. Samples
were placed in saline. 17 gauge needle was removed without
complication. A small dressing was placed over the puncture site.

COMPLICATIONS:
None.
FINDINGS: The liver echogenicity is normal. No focal liver lesion was
identified. Needle position was confirmed in the right hepatic lobe.
IMPRESSION: Ultrasound-guided core biopsies of the right hepatic lobe.
